# Patient Record
Sex: Male | Born: 1975 | Hispanic: No
Health system: Southern US, Community
[De-identification: ages and names within clinical notes are randomized; demographics above are authoritative.]

---

## 2017-03-08 ENCOUNTER — Other Ambulatory Visit: Payer: Self-pay | Admitting: Gerontology

## 2017-03-08 ENCOUNTER — Ambulatory Visit (INDEPENDENT_AMBULATORY_CARE_PROVIDER_SITE_OTHER): Payer: Self-pay

## 2017-03-08 DIAGNOSIS — X500XXA Overexertion from strenuous movement or load, initial encounter: Secondary | ICD-10-CM

## 2017-03-08 DIAGNOSIS — M4186 Other forms of scoliosis, lumbar region: Secondary | ICD-10-CM

## 2019-10-12 ENCOUNTER — Emergency Department (HOSPITAL_COMMUNITY)
Admission: EM | Admit: 2019-10-12 | Discharge: 2019-10-13 | Disposition: A | Discharge status: Home / Self Care | Payer: HRSA Program | Attending: Emergency Medicine | Admitting: Emergency Medicine

## 2019-10-12 ENCOUNTER — Emergency Department (HOSPITAL_COMMUNITY): Payer: HRSA Program

## 2019-10-12 DIAGNOSIS — U071 COVID-19: Secondary | ICD-10-CM | POA: Insufficient documentation

## 2019-10-12 DIAGNOSIS — R079 Chest pain, unspecified: Secondary | ICD-10-CM | POA: Insufficient documentation

## 2019-10-12 DIAGNOSIS — Z5321 Procedure and treatment not carried out due to patient leaving prior to being seen by health care provider: Secondary | ICD-10-CM | POA: Diagnosis not present

## 2019-10-12 DIAGNOSIS — R0602 Shortness of breath: Secondary | ICD-10-CM | POA: Diagnosis present

## 2019-10-12 LAB — CBC
HCT: 49.3 % (ref 39.0–52.0)
Hemoglobin: 16.5 g/dL (ref 13.0–17.0)
MCH: 28.7 pg (ref 26.0–34.0)
MCHC: 33.5 g/dL (ref 30.0–36.0)
MCV: 85.7 fL (ref 80.0–100.0)
Platelets: 215 10*3/uL (ref 150–400)
RBC: 5.75 MIL/uL (ref 4.22–5.81)
RDW: 12.8 % (ref 11.5–15.5)
WBC: 4.5 10*3/uL (ref 4.0–10.5)
nRBC: 0 % (ref 0.0–0.2)

## 2019-10-12 LAB — BASIC METABOLIC PANEL
Anion gap: 14 (ref 5–15)
BUN: 14 mg/dL (ref 6–20)
CO2: 22 mmol/L (ref 22–32)
Calcium: 9.6 mg/dL (ref 8.9–10.3)
Chloride: 101 mmol/L (ref 98–111)
Creatinine, Ser: 0.97 mg/dL (ref 0.61–1.24)
GFR calc Af Amer: >60 mL/min (ref >60)
GFR calc non Af Amer: >60 mL/min (ref >60)
Glucose, Bld: 149 mg/dL — ABNORMAL HIGH (ref 70–99)
Potassium: 3.8 mmol/L (ref 3.5–5.1)
Sodium: 137 mmol/L (ref 135–145)

## 2019-10-12 LAB — TROPONIN I (HIGH SENSITIVITY): Troponin I (High Sensitivity): 3 ng/L (ref <18)

## 2019-10-12 NOTE — ED Triage Notes (Signed)
Pt here from [facility] office with c/o sob and chest pain pt is 3 weeks post covid pt received 1 nitro and 324 asa Improved the pain

## 2019-10-13 NOTE — ED Notes (Signed)
Pt called 3x no answer  

## 2021-05-06 IMAGING — CR DG CHEST 2V
2 series · 2 of 2 positions shown · non-contrast
Comparison: None.

CLINICAL DATA: Shortness of breath and chest pain. Reported recent
[id number] positive

EXAM:
CHEST - 2 VIEW

[chest pa]
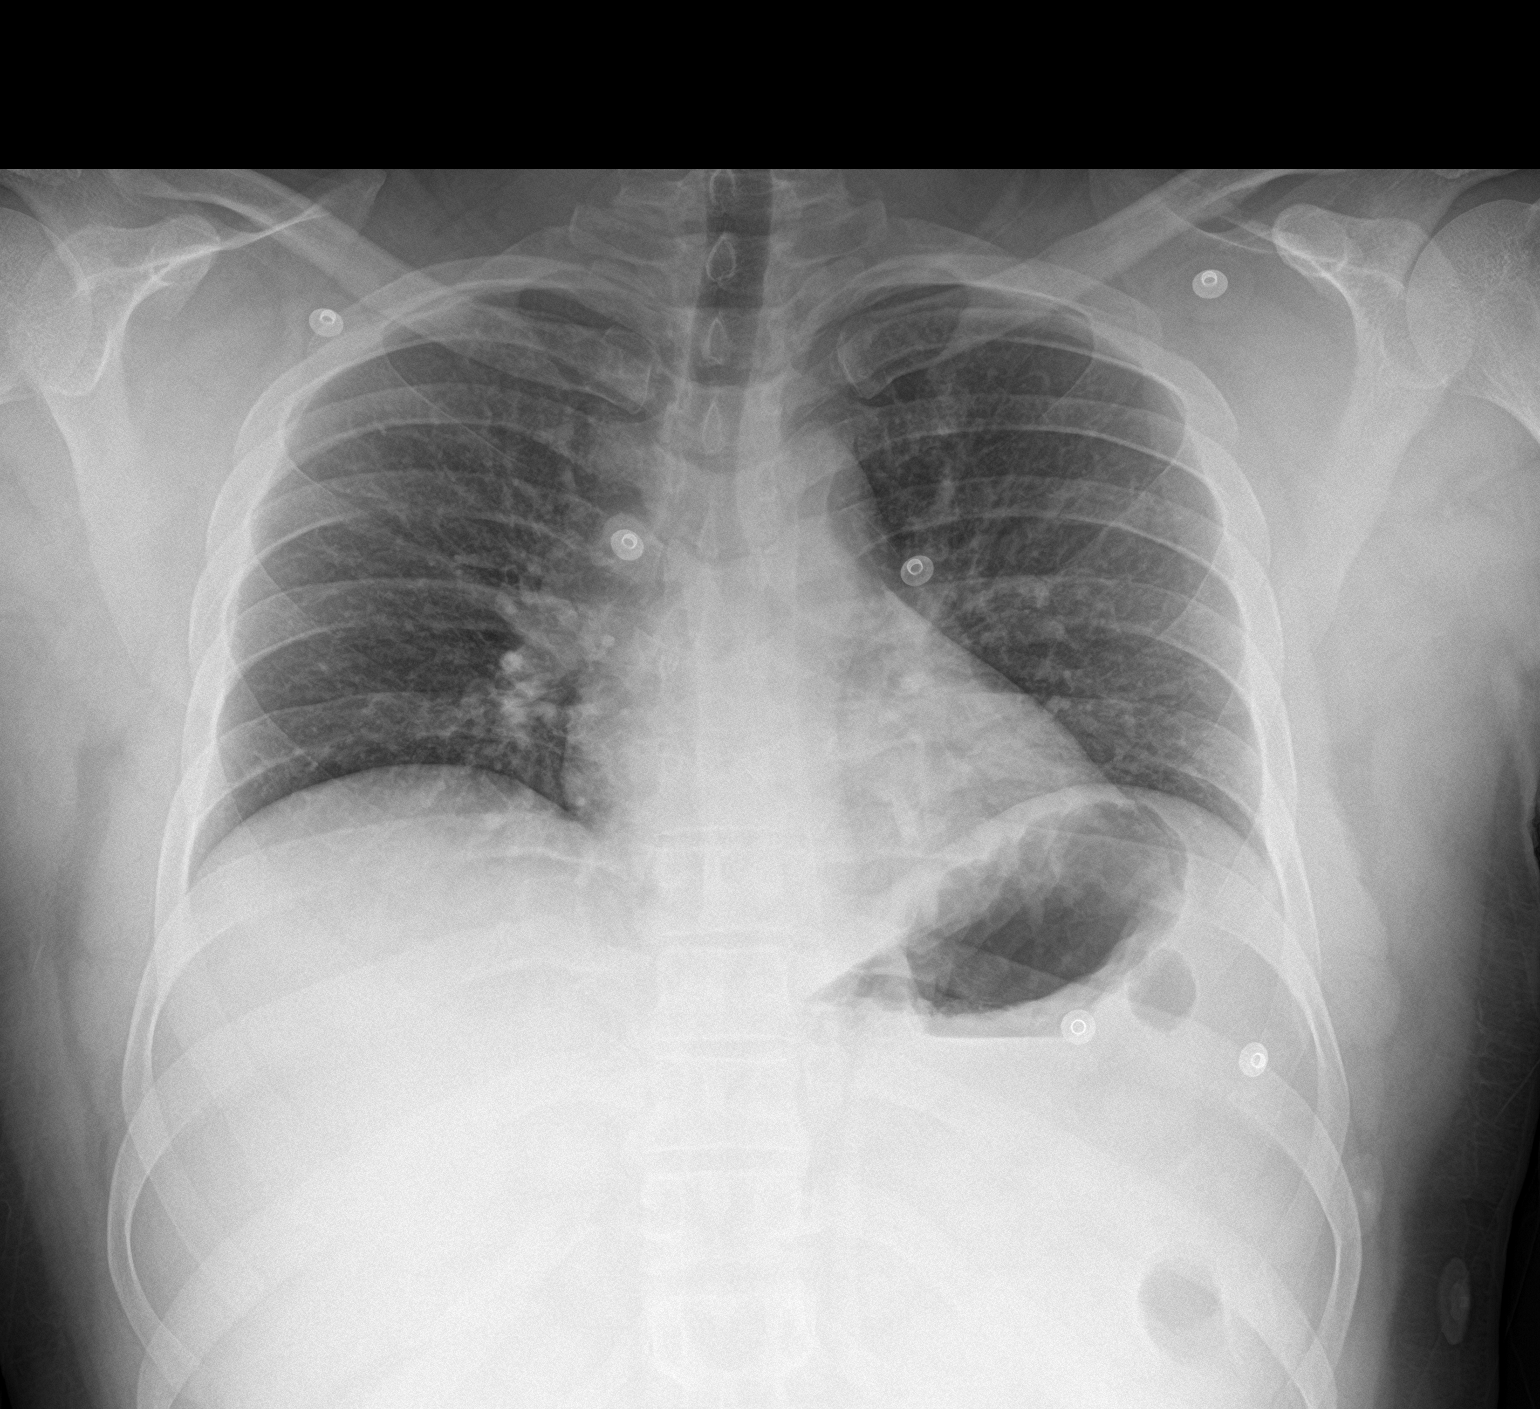

[chest lat]
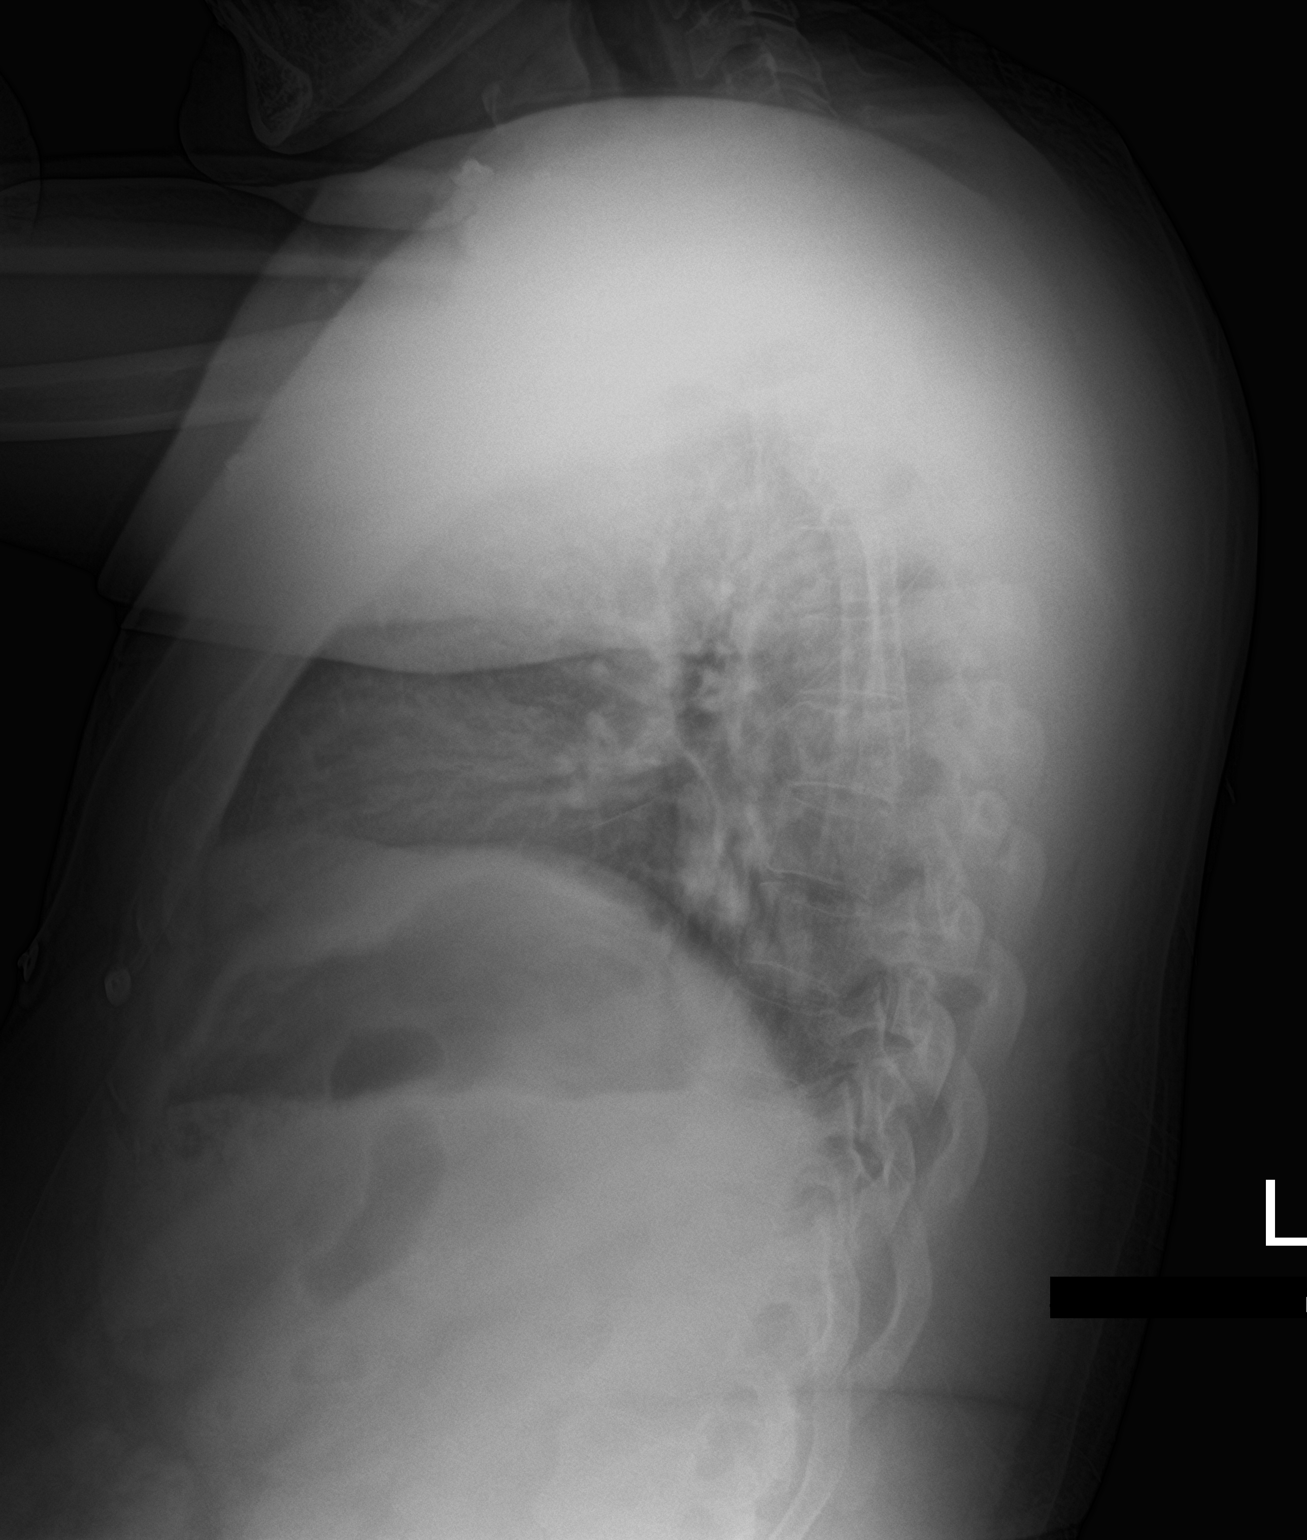

[2 of 2 positions shown; findings below may reference images not displayed]

FINDINGS: Degree of inspiration shallow. Lungs are clear. Heart size and
pulmonary vascularity are normal. No adenopathy. No bone lesions.
IMPRESSION: Shallow degree of inspiration.  Lungs clear.  No evident adenopathy.
